# Patient Record
Sex: Male | Born: 1995 | Race: Black or African American | Hispanic: No | Marital: Single | Smoking: Never smoker
Health system: Southern US, Community
[De-identification: ages and names within clinical notes are randomized; demographics above are authoritative.]

## PROBLEM LIST (undated history)

## (undated) HISTORY — PX: WISDOM TOOTH EXTRACTION: SHX21

## (undated) HISTORY — PX: HERNIA REPAIR: SHX51

## (undated) HISTORY — PX: NASAL FRACTURE SURGERY: SHX718

---
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
## 2015-06-22 ENCOUNTER — Emergency Department (HOSPITAL_COMMUNITY): Payer: No Typology Code available for payment source

## 2015-06-22 ENCOUNTER — Emergency Department (HOSPITAL_COMMUNITY)
Admission: EM | Admit: 2015-06-22 | Discharge: 2015-06-22 | Disposition: A | Discharge status: Home / Self Care | Payer: No Typology Code available for payment source | Attending: Emergency Medicine | Admitting: Emergency Medicine

## 2015-06-22 ENCOUNTER — Encounter (HOSPITAL_COMMUNITY): Payer: Self-pay | Admitting: Emergency Medicine

## 2015-06-22 DIAGNOSIS — Y92410 Unspecified street and highway as the place of occurrence of the external cause: Secondary | ICD-10-CM | POA: Diagnosis not present

## 2015-06-22 DIAGNOSIS — S3992XA Unspecified injury of lower back, initial encounter: Secondary | ICD-10-CM | POA: Insufficient documentation

## 2015-06-22 DIAGNOSIS — M545 Low back pain, unspecified: Secondary | ICD-10-CM

## 2015-06-22 DIAGNOSIS — S0990XA Unspecified injury of head, initial encounter: Secondary | ICD-10-CM | POA: Diagnosis not present

## 2015-06-22 DIAGNOSIS — Y998 Other external cause status: Secondary | ICD-10-CM | POA: Insufficient documentation

## 2015-06-22 DIAGNOSIS — Y9389 Activity, other specified: Secondary | ICD-10-CM | POA: Diagnosis not present

## 2015-06-22 MED ORDER — CYCLOBENZAPRINE HCL 10 MG PO TABS: 10.0000 mg | ORAL_TABLET | Freq: Two times a day (BID) | ORAL | Status: DC | PRN

## 2015-06-22 MED ORDER — NAPROXEN 500 MG PO TABS: 500.0000 mg | ORAL_TABLET | Freq: Two times a day (BID) | ORAL | Status: DC
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
## 2015-06-22 NOTE — ED Notes (Addendum)
[employer] at bedside. Pt appropriately responsive to all questions. License returned to pt. Mother at bedside. Pt unable to stated where he is. Name, date, birth date accurate
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
## 2016-05-20 ENCOUNTER — Encounter (HOSPITAL_BASED_OUTPATIENT_CLINIC_OR_DEPARTMENT_OTHER): Payer: Self-pay | Admitting: Emergency Medicine

## 2016-05-20 ENCOUNTER — Emergency Department (HOSPITAL_BASED_OUTPATIENT_CLINIC_OR_DEPARTMENT_OTHER)
Admission: EM | Admit: 2016-05-20 | Discharge: 2016-05-20 | Disposition: A | Discharge status: Home / Self Care | Payer: Medicaid Other | Attending: Emergency Medicine | Admitting: Emergency Medicine

## 2016-05-20 DIAGNOSIS — R590 Localized enlarged lymph nodes: Secondary | ICD-10-CM

## 2016-05-20 DIAGNOSIS — N342 Other urethritis: Secondary | ICD-10-CM | POA: Insufficient documentation

## 2016-05-20 LAB — URINALYSIS, ROUTINE W REFLEX MICROSCOPIC
Glucose, UA: NEGATIVE mg/dL
Ketones, ur: NEGATIVE mg/dL
NITRITE: NEGATIVE
Protein, ur: 100 mg/dL — AB
SPECIFIC GRAVITY, URINE: 1.037 — AB (ref 1.005–1.030)
pH: 6 (ref 5.0–8.0)

## 2016-05-20 LAB — URINALYSIS, MICROSCOPIC (REFLEX)

## 2016-05-20 MED ORDER — CEFTRIAXONE SODIUM 250 MG IJ SOLR
250.0000 mg | Freq: Once | INTRAMUSCULAR | Status: AC
  Administered 2016-05-20: 250 mg via INTRAMUSCULAR
  Filled 2016-05-20: qty 250

## 2016-05-20 MED ORDER — DOXYCYCLINE HYCLATE 100 MG PO CAPS: 100.0000 mg | ORAL_CAPSULE | Freq: Two times a day (BID) | ORAL | 0 refills | Status: AC

## 2016-05-20 MED ORDER — DOXYCYCLINE HYCLATE 100 MG PO TABS
100.0000 mg | ORAL_TABLET | Freq: Once | ORAL | Status: AC
  Administered 2016-05-20: 100 mg via ORAL
  Filled 2016-05-20: qty 1

## 2016-05-20 MED ORDER — METRONIDAZOLE 500 MG PO TABS
2000.0000 mg | ORAL_TABLET | Freq: Once | ORAL | Status: AC
  Administered 2016-05-20: 2000 mg via ORAL
  Filled 2016-05-20: qty 4

## 2016-05-20 MED FILL — DOXYCYCLINE HYC 100 MG CAP: 100 | 14 days supply | Qty: 28 | Fill #0

## 2016-05-20 NOTE — ED Notes (Signed)
Offered fluids but declined

## 2016-05-20 NOTE — ED Provider Notes (Signed)
[facility] Provider Note   CSN: [id number] Arrival date & time: 05/20/16  1040     History   Chief Complaint Chief Complaint  Patient presents with  . Dysuria  . Groin Pain    HPI Herbert Mills is a 21 y.o. male.  The history is provided by the patient.  Dysuria   This is a new problem. Episode onset: 6 days. The problem occurs every urination. The problem has not changed since onset.The quality of the pain is described as burning. The pain is moderate. There has been no fever. Pertinent negatives include no chills, no sweats, no nausea, no discharge, no hematuria and no flank pain.  Groin Pain    Sexual active with one partner. Denies anal intercourse.  History reviewed. No pertinent past medical history.  There are no active problems to display for this patient.   Past Surgical History:  Procedure Laterality Date  . HERNIA REPAIR    . NASAL FRACTURE SURGERY    . WISDOM TOOTH EXTRACTION         Home Medications    Prior to Admission medications   Medication Sig Start Date End Date Taking? Authorizing Provider  doxycycline (VIBRAMYCIN) 100 MG capsule Take 1 capsule (100 mg total) by mouth 2 (two) times daily. 05/20/16 06/03/16  Nira ConnPedro Eduardo Skilar Marcou, MD    Family History History reviewed. No pertinent family history.  Social History Social History  Substance Use Topics  . Smoking status: Never Smoker  . Smokeless tobacco: Never Used  . Alcohol use No     Allergies   Other   Review of Systems Review of Systems  Constitutional: Negative for chills.  Gastrointestinal: Negative for nausea.  Genitourinary: Positive for dysuria. Negative for flank pain, hematuria, penile swelling, scrotal swelling and testicular pain.  Ten systems are reviewed and are negative for acute change except as noted in the HPI    Physical Exam Updated Vital Signs BP 141/86 (BP Location: Right Arm)   Pulse (!) 55   Temp 98.1 F (36.7 C) (Oral)   Resp 17   Ht  6\' 2"  (1.88 m)   Wt 170 lb (77.1 kg)   SpO2 100%   BMI 21.83 kg/m   Physical Exam  Constitutional: He is oriented to person, place, and time. He appears well-developed and well-nourished. No distress.  HENT:  Head: Normocephalic and atraumatic.  Nose: Nose normal.  Eyes: Conjunctivae and EOM are normal. Pupils are equal, round, and reactive to light. Right eye exhibits no discharge. Left eye exhibits no discharge. No scleral icterus.  Neck: Normal range of motion. Neck supple.  Cardiovascular: Normal rate and regular rhythm.  Exam reveals no gallop and no friction rub.   No murmur heard. Pulmonary/Chest: Effort normal and breath sounds normal. No stridor. No respiratory distress. He has no rales.  Abdominal: Soft. He exhibits no distension. There is no tenderness. Hernia confirmed negative in the right inguinal area and confirmed negative in the left inguinal area.  Genitourinary: Right testis shows no mass, no swelling and no tenderness. Left testis shows no mass, no swelling and no tenderness. Circumcised.  Musculoskeletal: He exhibits no edema or tenderness.  Lymphadenopathy: Inguinal adenopathy noted on the left side. No inguinal adenopathy noted on the right side.  Neurological: He is alert and oriented to person, place, and time.  Skin: Skin is warm and dry. No rash noted. He is not diaphoretic. No erythema.  Psychiatric: He has a normal mood and affect.  Vitals  reviewed.    ED Treatments / Results  Labs (all labs ordered are listed, but only abnormal results are displayed) Labs Reviewed  URINALYSIS, ROUTINE W REFLEX MICROSCOPIC - Abnormal; Notable for the following:       Result Value   Color, Urine AMBER (*)    APPearance TURBID (*)    Specific Gravity, Urine 1.037 (*)    Hgb urine dipstick MODERATE (*)    Bilirubin Urine SMALL (*)    Protein, ur 100 (*)    Leukocytes, UA LARGE (*)    All other components within normal limits  URINALYSIS, MICROSCOPIC (REFLEX) -  Abnormal; Notable for the following:    Bacteria, UA FEW (*)    Squamous Epithelial / LPF 0-5 (*)    All other components within normal limits  GC/CHLAMYDIA PROBE AMP ([place]) NOT AT [facility]    EKG  EKG Interpretation None       Radiology No results found.  Procedures Procedures (including critical care time)  Medications Ordered in ED Medications  cefTRIAXone (ROCEPHIN) injection 250 mg (250 mg Intramuscular Given 05/20/16 1414)  doxycycline (VIBRA-TABS) tablet 100 mg (100 mg Oral Given 05/20/16 1413)  metroNIDAZOLE (FLAGYL) tablet 2,000 mg (2,000 mg Oral Given 05/20/16 1433)     Initial Impression / Assessment and Plan / ED Course  I have reviewed the triage vital signs and the nursing notes.  Pertinent labs & imaging results that were available during my care of the patient were reviewed by me and considered in my medical decision making (see chart for details).  Clinical Course     Likely chlamydial/GC infection. No signs suggesting HSV, syphilis or H. Ducrey. Will also treat for possible Trich. Given rocephin IM, Doxy 100mg , and Flagyl 2000mg  here.The patient is safe for discharge with strict return precautions.    Final Clinical Impressions(s) / ED Diagnoses   Final diagnoses:  Urethritis  Inguinal adenopathy   Disposition: Discharge  Condition: Good  I have discussed the results, Dx and Tx plan with the patient who expressed understanding and agree(s) with the plan. Discharge instructions discussed at great length. The patient was given strict return precautions who verbalized understanding of the instructions. No further questions at time of discharge.    Discharge Medication List as of 05/20/2016  2:22 PM    START taking these medications   Details  doxycycline (VIBRAMYCIN) 100 MG capsule Take 1 capsule (100 mg total) by mouth 2 (two) times daily., Starting Tue 05/20/2016, Until Tue 06/03/2016, Print        Follow Up: Health department   For close  follow up to retest for treatment efficacy for STI      Nira Conn, MD 05/21/16 1135

## 2016-05-20 NOTE — ED Notes (Signed)
Patient denies pain and is resting comfortably.  

## 2016-05-20 NOTE — ED Triage Notes (Signed)
Pt having burning with urination, pain to groin since last Thursday.  Denies fever.  Not feeling well initially.

## 2016-05-20 NOTE — Discharge Instructions (Signed)
Advised to practice safe sex and have all partners evaluated and treated at the local health department. Also advised to follow with the health Department in 1-2 weeks to confirm effectiveness of treatment and receive additional education/evaluation. Return precautions given. ° ° ° °

## 2016-05-21 LAB — GC/CHLAMYDIA PROBE AMP (~~LOC~~) NOT AT ARMC
Chlamydia: NEGATIVE
NEISSERIA GONORRHEA: POSITIVE — AB
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
## 2023-06-03 ENCOUNTER — Ambulatory Visit (HOSPITAL_COMMUNITY)
Admission: RE | Admit: 2023-06-03 | Discharge: 2023-06-03 | Disposition: A | Discharge status: Home / Self Care | Payer: Self-pay | Source: Ambulatory Visit | Attending: Family Medicine | Admitting: Family Medicine

## 2023-06-03 ENCOUNTER — Encounter (HOSPITAL_COMMUNITY): Payer: Self-pay

## 2023-06-03 VITALS — BP 135/72 | HR 63 | Temp 98.3°F | Resp 18

## 2023-06-03 DIAGNOSIS — Z202 Contact with and (suspected) exposure to infections with a predominantly sexual mode of transmission: Secondary | ICD-10-CM | POA: Insufficient documentation

## 2023-06-03 DIAGNOSIS — N4889 Other specified disorders of penis: Secondary | ICD-10-CM | POA: Insufficient documentation

## 2023-06-03 MED ORDER — METRONIDAZOLE 500 MG PO TABS
2000.0000 mg | ORAL_TABLET | Freq: Once | ORAL | Status: AC
Start: 2023-06-03 — End: 2023-06-03
  Administered 2023-06-03: 2000 mg via ORAL

## 2023-06-03 MED ORDER — METRONIDAZOLE 500 MG PO TABS
ORAL_TABLET | ORAL | Status: AC
  Filled 2023-06-03: qty 4
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
## 2023-06-03 NOTE — Discharge Instructions (Signed)
You have been given the following today for treatment of trichomonas:  Meds ordered this encounter  Medications   metroNIDAZOLE (FLAGYL) tablet 2,000 mg    Even though we have treated you today, we have sent testing for sexually transmitted infections. We will notify you of any positive results once they are received. If required, we will prescribe any medications you might need.  Please refrain from all sexual activity for at least the next seven days.
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
## 2023-06-05 LAB — CYTOLOGY, (ORAL, ANAL, URETHRAL) ANCILLARY ONLY
Chlamydia: NEGATIVE
Comment: NEGATIVE
Comment: NEGATIVE
Comment: NORMAL
Neisseria Gonorrhea: NEGATIVE
Trichomonas: POSITIVE — AB
